# Patient Record
Sex: Female | Born: 1935 | Race: White | Hispanic: No | Smoking: Former smoker
Health system: Southern US, Community
[De-identification: ages and names within clinical notes are randomized; demographics above are authoritative.]

## PROBLEM LIST (undated history)

## (undated) DIAGNOSIS — I10 Essential (primary) hypertension: Secondary | ICD-10-CM

## (undated) DIAGNOSIS — H3530 Unspecified macular degeneration: Secondary | ICD-10-CM

## (undated) DIAGNOSIS — E7800 Pure hypercholesterolemia, unspecified: Secondary | ICD-10-CM

## (undated) DIAGNOSIS — M1990 Unspecified osteoarthritis, unspecified site: Secondary | ICD-10-CM

## (undated) DIAGNOSIS — N289 Disorder of kidney and ureter, unspecified: Secondary | ICD-10-CM

## (undated) DIAGNOSIS — K449 Diaphragmatic hernia without obstruction or gangrene: Secondary | ICD-10-CM

## (undated) DIAGNOSIS — J449 Chronic obstructive pulmonary disease, unspecified: Secondary | ICD-10-CM

## (undated) HISTORY — PX: APPENDECTOMY: SHX54

## (undated) HISTORY — PX: REPLACEMENT TOTAL KNEE: SUR1224

## (undated) HISTORY — PX: ABDOMINAL HYSTERECTOMY: SHX81

## (undated) HISTORY — PX: BREAST SURGERY: SHX581

## (undated) SURGERY — EGD (ESOPHAGOGASTRODUODENOSCOPY)
Anesthesia: Moderate Sedation

---

## 1997-10-06 ENCOUNTER — Other Ambulatory Visit: Admission: RE | Admit: 1997-10-06 | Discharge: 1997-10-06 | Payer: Self-pay | Admitting: Obstetrics and Gynecology

## 1998-10-20 ENCOUNTER — Other Ambulatory Visit: Admission: RE | Admit: 1998-10-20 | Discharge: 1998-10-20 | Payer: Self-pay | Admitting: Obstetrics and Gynecology

## 1999-12-05 ENCOUNTER — Other Ambulatory Visit: Admission: RE | Admit: 1999-12-05 | Discharge: 1999-12-05 | Payer: Self-pay | Admitting: Obstetrics and Gynecology

## 2001-05-06 ENCOUNTER — Observation Stay (HOSPITAL_COMMUNITY): Admission: RE | Admit: 2001-05-06 | Discharge: 2001-05-07 | Payer: Self-pay | Admitting: Obstetrics and Gynecology

## 2003-12-18 ENCOUNTER — Ambulatory Visit: Admission: RE | Admit: 2003-12-18 | Discharge: 2003-12-18 | Payer: Self-pay | Admitting: Orthopedic Surgery

## 2003-12-31 ENCOUNTER — Ambulatory Visit (HOSPITAL_COMMUNITY): Admission: RE | Admit: 2003-12-31 | Discharge: 2003-12-31 | Payer: Self-pay | Admitting: Orthopedic Surgery

## 2004-08-29 ENCOUNTER — Ambulatory Visit: Payer: Self-pay | Admitting: Cardiology

## 2004-09-02 ENCOUNTER — Ambulatory Visit: Payer: Self-pay | Admitting: Cardiology

## 2004-09-14 ENCOUNTER — Ambulatory Visit: Payer: Self-pay | Admitting: Physical Medicine & Rehabilitation

## 2004-09-14 ENCOUNTER — Inpatient Hospital Stay (HOSPITAL_COMMUNITY): Admission: RE | Admit: 2004-09-14 | Discharge: 2004-09-19 | Payer: Self-pay | Admitting: Orthopedic Surgery

## 2004-09-19 ENCOUNTER — Inpatient Hospital Stay
Admission: RE | Admit: 2004-09-19 | Discharge: 2004-09-24 | Payer: Self-pay | Admitting: Physical Medicine & Rehabilitation

## 2006-08-03 IMAGING — CT CT CHEST W/ CM
1 of 2 series · 14 of 30 positions shown, 18 images · IV contrast (omnipaque)
Comparison: none

CLINICAL DATA: Left upper lobe pulmonary nodule on conventional radiography.
TECHNIQUE: Contiguous axial CT images were obtained from the lung apices to the bases following the intravenous administration of 100 cc of Omnipaque 300 IV contrast.
 Comparing chest radiograph of 12/18/03.
 CT OF THE CHEST WITH CONTRAST:

[Series 3: — · axial · 0.64mm/px · z∈[-322,-42]mm · 14 of 66 slices shown, 18 images]
[im 5/66  mediastinal]
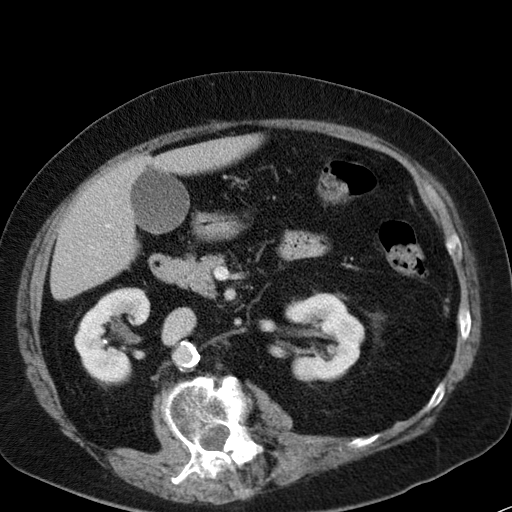
[im 5/66  lung]
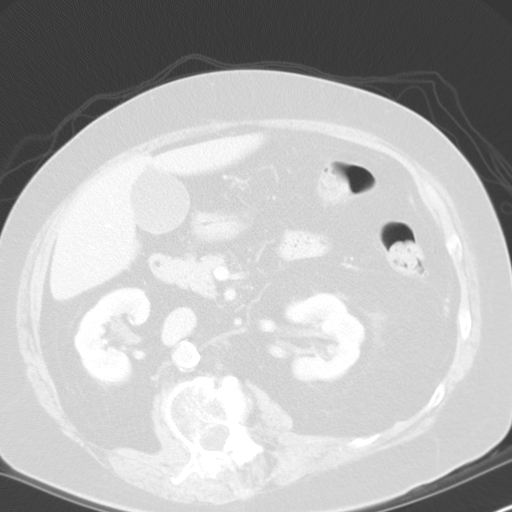
[im 10/66  lung]
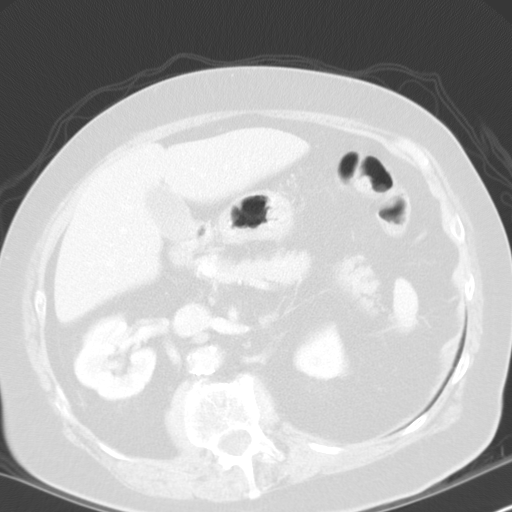
[im 14/66  lung]
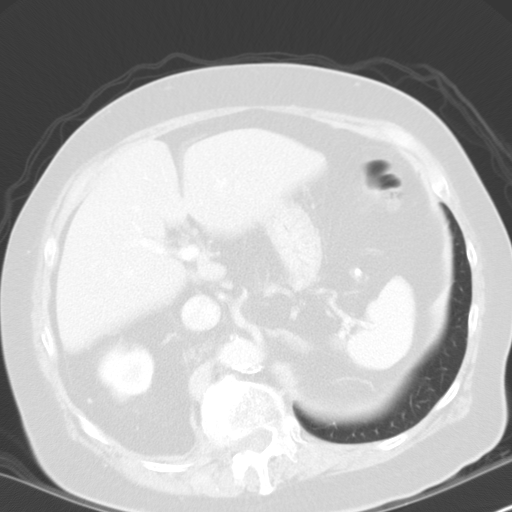
[im 19/66  lung]
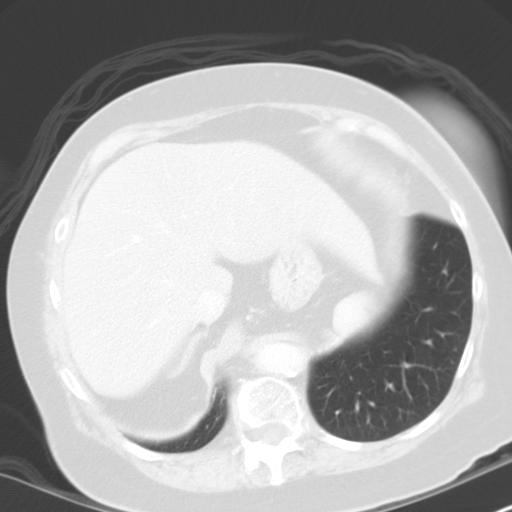
[im 24/66  mediastinal]
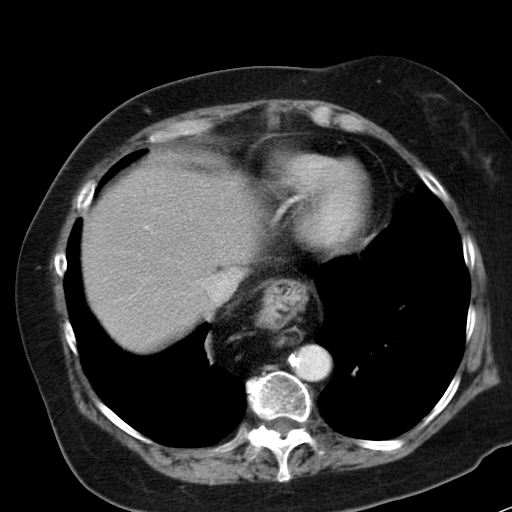
[im 24/66  lung]
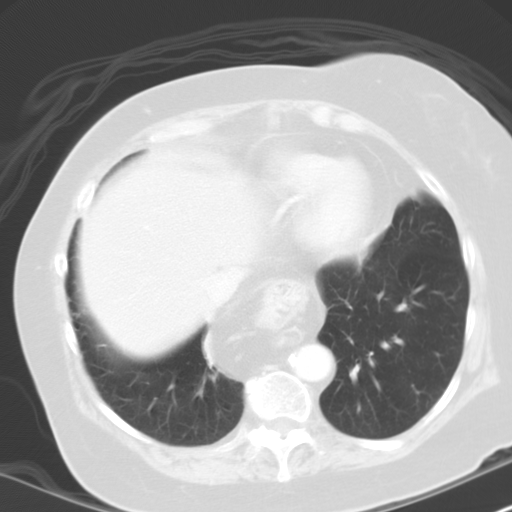
[im 28/66  lung]
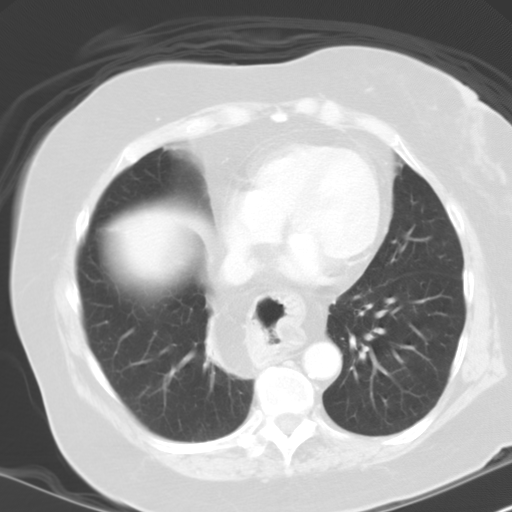
[im 31/66  lung]
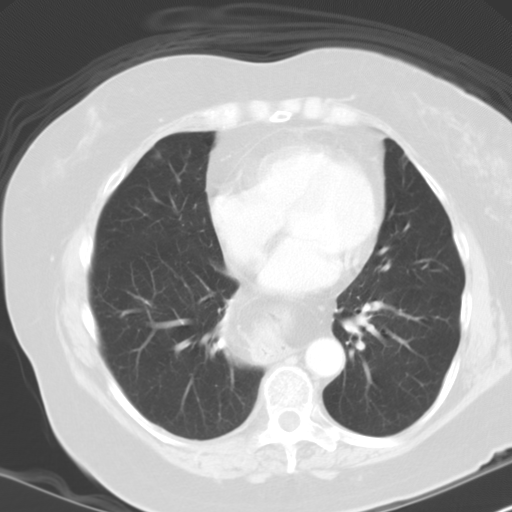
[im 33/66  lung]
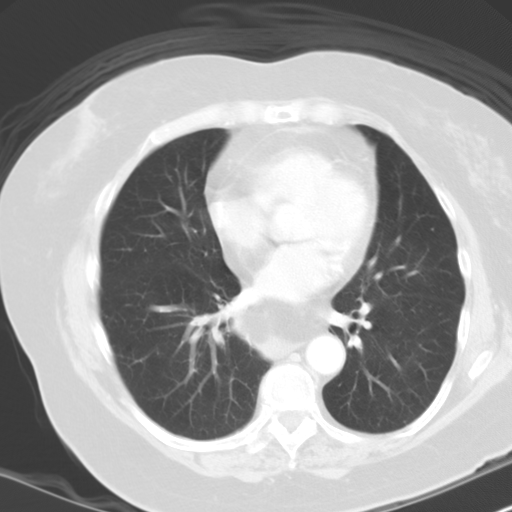
[im 38/66  mediastinal]
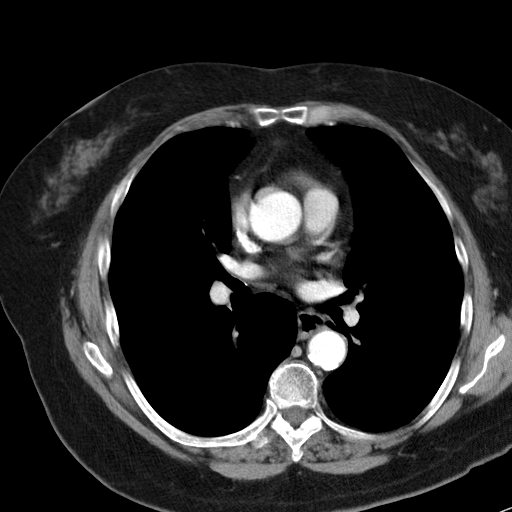
[im 38/66  lung]
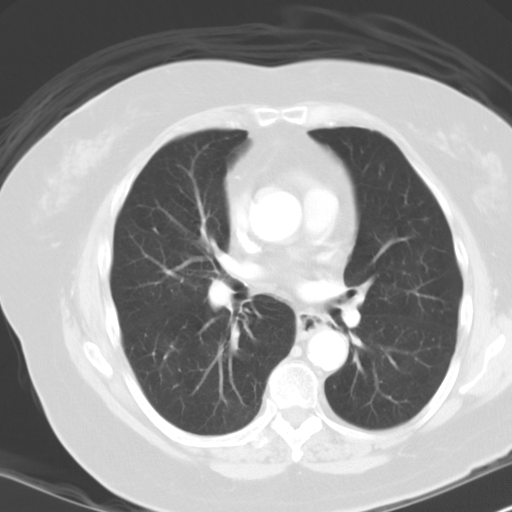
[im 42/66  lung]
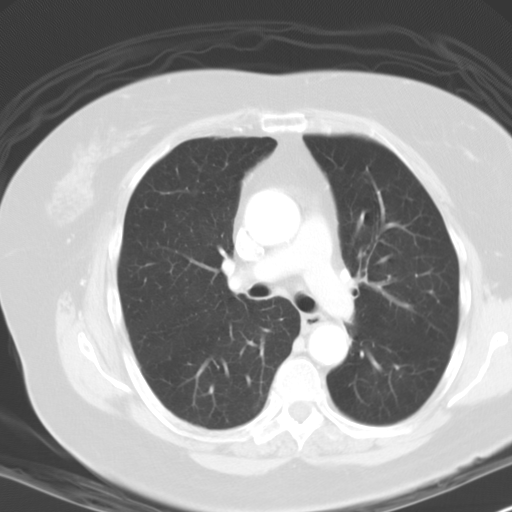
[im 47/66  lung]
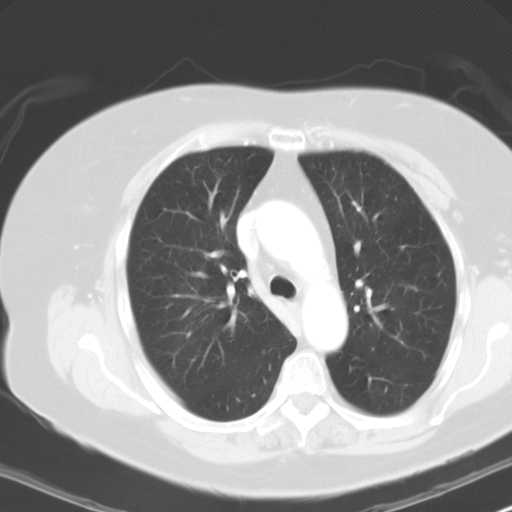
[im 52/66  lung]
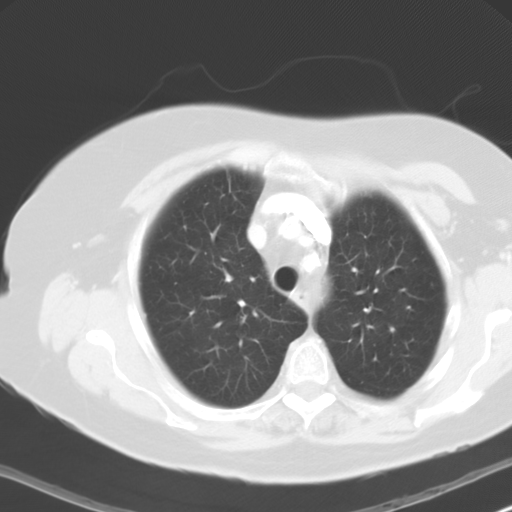
[im 56/66  mediastinal]
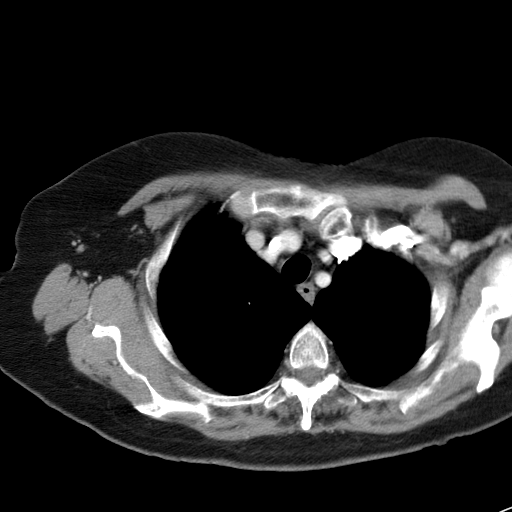
[im 56/66  lung]
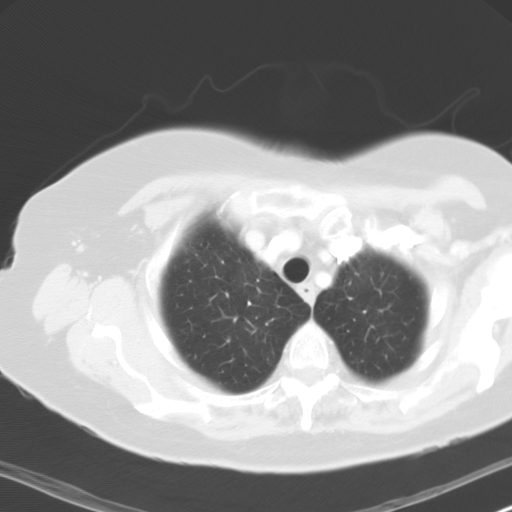
[im 61/66  lung]
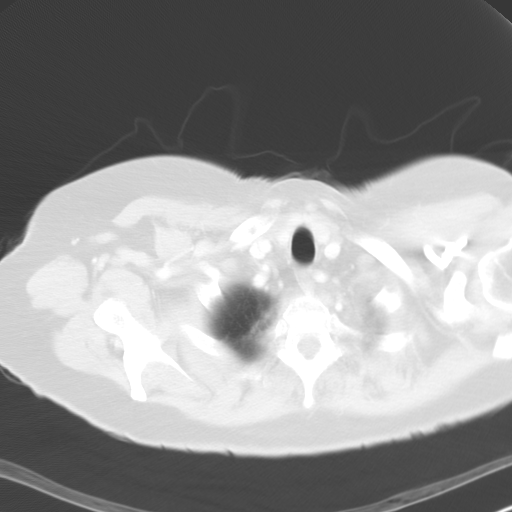

[14 of 30 positions shown; findings below may reference images not displayed]

FINDINGS: The scout view reveals lumbar scoliosis.  There is a moderate sized hiatal hernia. 
 There is some atherosclerotic calcification of the aortic arch and branch vessels. No pathologic hilar or mediastinal adenopathy.  
 The nodular density was conspicuous on the prior chest radiograph, but there is no intrapulmonary nodule in this region on chest CT.  I suspect that this either represented artifact, skin lesion, or a small axillary or subpectoral node that was inadvertently conspicuous on conventional radiography.  No pulmonary nodule is currently evident.  Tracheobronchial tree appears unremarkable. In the left midkidney there is a nonobstructive 4 mm calculus and in the left kidney lower pole there is a 2 to 3 mm calculus. In the right mid kidney there is a 2 to 3 mm nonobstructive calculus.
IMPRESSION: 1.  No pulmonary nodule is visible on CT scan.  This likely represented an incidental or artifactual finding on conventional radiography.
 2.  Hiatal hernia. 
 3.  Nephrolithiasis.

## 2016-09-14 ENCOUNTER — Emergency Department (HOSPITAL_COMMUNITY)
Admission: EM | Admit: 2016-09-14 | Discharge: 2016-09-14 | Disposition: A | Discharge status: Home / Self Care | Payer: Medicare Other | Attending: Emergency Medicine | Admitting: Emergency Medicine

## 2016-09-14 ENCOUNTER — Encounter (HOSPITAL_COMMUNITY)
Admission: EM | Disposition: A | Discharge status: Home / Self Care | Payer: Self-pay | Source: Home / Self Care | Attending: Emergency Medicine

## 2016-09-14 ENCOUNTER — Encounter (HOSPITAL_COMMUNITY): Payer: Self-pay | Admitting: Emergency Medicine

## 2016-09-14 DIAGNOSIS — I129 Hypertensive chronic kidney disease with stage 1 through stage 4 chronic kidney disease, or unspecified chronic kidney disease: Secondary | ICD-10-CM | POA: Diagnosis not present

## 2016-09-14 DIAGNOSIS — E7800 Pure hypercholesterolemia, unspecified: Secondary | ICD-10-CM | POA: Insufficient documentation

## 2016-09-14 DIAGNOSIS — N183 Chronic kidney disease, stage 3 (moderate): Secondary | ICD-10-CM | POA: Insufficient documentation

## 2016-09-14 DIAGNOSIS — T18128A Food in esophagus causing other injury, initial encounter: Secondary | ICD-10-CM | POA: Diagnosis not present

## 2016-09-14 DIAGNOSIS — Z79810 Long term (current) use of selective estrogen receptor modulators (SERMs): Secondary | ICD-10-CM | POA: Diagnosis not present

## 2016-09-14 DIAGNOSIS — Z96659 Presence of unspecified artificial knee joint: Secondary | ICD-10-CM | POA: Diagnosis not present

## 2016-09-14 DIAGNOSIS — K222 Esophageal obstruction: Secondary | ICD-10-CM | POA: Diagnosis not present

## 2016-09-14 DIAGNOSIS — Z87891 Personal history of nicotine dependence: Secondary | ICD-10-CM | POA: Insufficient documentation

## 2016-09-14 DIAGNOSIS — J449 Chronic obstructive pulmonary disease, unspecified: Secondary | ICD-10-CM | POA: Diagnosis not present

## 2016-09-14 DIAGNOSIS — Z7951 Long term (current) use of inhaled steroids: Secondary | ICD-10-CM | POA: Insufficient documentation

## 2016-09-14 DIAGNOSIS — K449 Diaphragmatic hernia without obstruction or gangrene: Secondary | ICD-10-CM | POA: Diagnosis not present

## 2016-09-14 DIAGNOSIS — Z79899 Other long term (current) drug therapy: Secondary | ICD-10-CM | POA: Diagnosis not present

## 2016-09-14 DIAGNOSIS — X58XXXA Exposure to other specified factors, initial encounter: Secondary | ICD-10-CM | POA: Diagnosis not present

## 2016-09-14 DIAGNOSIS — R1110 Vomiting, unspecified: Secondary | ICD-10-CM | POA: Diagnosis present

## 2016-09-14 HISTORY — DX: Unspecified osteoarthritis, unspecified site: M19.90

## 2016-09-14 HISTORY — PX: ESOPHAGOGASTRODUODENOSCOPY: SHX5428

## 2016-09-14 HISTORY — DX: Disorder of kidney and ureter, unspecified: N28.9

## 2016-09-14 HISTORY — DX: Unspecified macular degeneration: H35.30

## 2016-09-14 HISTORY — DX: Essential (primary) hypertension: I10

## 2016-09-14 HISTORY — DX: Pure hypercholesterolemia, unspecified: E78.00

## 2016-09-14 HISTORY — DX: Chronic obstructive pulmonary disease, unspecified: J44.9

## 2016-09-14 HISTORY — DX: Diaphragmatic hernia without obstruction or gangrene: K44.9

## 2016-09-14 LAB — CBC WITH DIFFERENTIAL/PLATELET
Basophils Absolute: 0 10*3/uL (ref 0.0–0.1)
Basophils Relative: 1 %
Eosinophils Absolute: 0.3 10*3/uL (ref 0.0–0.7)
Eosinophils Relative: 4 %
HCT: 41.8 % (ref 36.0–46.0)
Hemoglobin: 13.4 g/dL (ref 12.0–15.0)
Lymphocytes Relative: 36 %
Lymphs Abs: 2.6 10*3/uL (ref 0.7–4.0)
MCH: 31.2 pg (ref 26.0–34.0)
MCHC: 32.1 g/dL (ref 30.0–36.0)
MCV: 97.2 fL (ref 78.0–100.0)
Monocytes Absolute: 0.5 10*3/uL (ref 0.1–1.0)
Monocytes Relative: 7 %
Neutro Abs: 3.8 10*3/uL (ref 1.7–7.7)
Neutrophils Relative %: 52 %
Platelets: 281 10*3/uL (ref 150–400)
RBC: 4.3 MIL/uL (ref 3.87–5.11)
RDW: 15.7 % — ABNORMAL HIGH (ref 11.5–15.5)
WBC: 7.3 10*3/uL (ref 4.0–10.5)

## 2016-09-14 LAB — COMPREHENSIVE METABOLIC PANEL
ALT: 15 U/L (ref 14–54)
AST: 21 U/L (ref 15–41)
Albumin: 3.8 g/dL (ref 3.5–5.0)
Alkaline Phosphatase: 55 U/L (ref 38–126)
Anion gap: 8 (ref 5–15)
BUN: 12 mg/dL (ref 6–20)
CO2: 26 mmol/L (ref 22–32)
Calcium: 8.7 mg/dL — ABNORMAL LOW (ref 8.9–10.3)
Chloride: 104 mmol/L (ref 101–111)
Creatinine, Ser: 0.77 mg/dL (ref 0.44–1.00)
GFR calc Af Amer: >60 mL/min (ref >60)
GFR calc non Af Amer: >60 mL/min (ref >60)
Glucose, Bld: 100 mg/dL — ABNORMAL HIGH (ref 65–99)
Potassium: 3.9 mmol/L (ref 3.5–5.1)
Sodium: 138 mmol/L (ref 135–145)
Total Bilirubin: 0.5 mg/dL (ref 0.3–1.2)
Total Protein: 7.3 g/dL (ref 6.5–8.1)

## 2016-09-14 SURGERY — EGD (ESOPHAGOGASTRODUODENOSCOPY)
Anesthesia: Moderate Sedation

## 2016-09-14 MED ORDER — SODIUM CHLORIDE 0.9 % IV SOLN
INTRAVENOUS | Status: DC
Start: 2016-09-14 — End: 2016-09-14

## 2016-09-14 MED ORDER — GLUCAGON HCL RDNA (DIAGNOSTIC) 1 MG IJ SOLR
1.0000 mg | Freq: Once | INTRAMUSCULAR | Status: AC
  Administered 2016-09-14: 1 mg via INTRAVENOUS
  Filled 2016-09-14: qty 1

## 2016-09-14 MED ORDER — LIDOCAINE VISCOUS 2 % MT SOLN
OROMUCOSAL | Status: AC
  Filled 2016-09-14: qty 15

## 2016-09-14 MED ORDER — LORAZEPAM 2 MG/ML IJ SOLN
1.0000 mg | Freq: Once | INTRAMUSCULAR | Status: AC
  Administered 2016-09-14: 1 mg via INTRAVENOUS
  Filled 2016-09-14: qty 1

## 2016-09-14 MED ORDER — MIDAZOLAM HCL 5 MG/5ML IJ SOLN
INTRAMUSCULAR | Status: DC | PRN
  Administered 2016-09-14: 1 mg via INTRAVENOUS

## 2016-09-14 MED ORDER — SODIUM CHLORIDE 0.9 % IV BOLUS (SEPSIS)
500.0000 mL | Freq: Once | INTRAVENOUS | Status: AC
  Administered 2016-09-14: 500 mL via INTRAVENOUS

## 2016-09-14 MED ORDER — MEPERIDINE HCL 100 MG/ML IJ SOLN
INTRAMUSCULAR | Status: DC | PRN
  Administered 2016-09-14: 25 mg via INTRAVENOUS

## 2016-09-14 MED ORDER — FENTANYL CITRATE (PF) 100 MCG/2ML IJ SOLN
INTRAMUSCULAR | Status: AC
  Filled 2016-09-14: qty 2

## 2016-09-14 MED ORDER — LIDOCAINE VISCOUS 2 % MT SOLN
OROMUCOSAL | Status: DC | PRN
  Administered 2016-09-14: 4 mL via OROMUCOSAL

## 2016-09-14 MED ORDER — STERILE WATER FOR IRRIGATION IR SOLN
Status: DC | PRN
  Administered 2016-09-14: 100 mL

## 2016-09-14 MED ORDER — ONDANSETRON HCL 4 MG/2ML IJ SOLN
INTRAMUSCULAR | Status: AC
  Filled 2016-09-14: qty 2

## 2016-09-14 MED ORDER — MIDAZOLAM HCL 5 MG/5ML IJ SOLN
INTRAMUSCULAR | Status: AC
  Filled 2016-09-14: qty 10

## 2016-09-14 NOTE — Op Note (Signed)
[facility] Patient Name: Morgan Chapman Procedure Date: 09/14/2016 5:16 PM MRN: [id number] Date of Birth: 1924/11/18 Attending MD: Gennette Pac , MD CSN: [id number] Age: 83 Admit Type: Outpatient Procedure:                Upper GI endoscopy Indications:              Foreign body in the esophagus Providers:                Gennette Pac, MD, Edrick Kins, RN, Burke Keels, Technician Referring MD:              Medicines:                Midazolam 1 mg IV, Fentanyl 25 micrograms IV,                            Ondansetron 4 mg IV Complications:            No immediate complications. Estimated Blood Loss:     Estimated blood loss was minimal. Estimated blood                            loss was minimal. Procedure:                Pre-Anesthesia Assessment:                           - Prior to the procedure, a History and Physical                            was performed, and patient medications and                            allergies were reviewed. The patient's tolerance of                            previous anesthesia was also reviewed. The risks                            and benefits of the procedure and the sedation                            options and risks were discussed with the patient.                            All questions were answered, and informed consent                            was obtained. Prior Anticoagulants: The patient has                            taken no previous anticoagulant or antiplatelet  agents. ASA Grade Assessment: III - A patient with                            severe systemic disease. After reviewing the risks                            and benefits, the patient was deemed in                            satisfactory condition to undergo the procedure.                           After obtaining informed consent, the endoscope was                            passed under direct  vision. Throughout the                            procedure, the patient's blood pressure, pulse, and                            oxygen saturations were monitored continuously. The                            EG-[id number] ([id number]) scope was introduced through the                            mouth, and advanced to the gastric cardia. The                            upper GI endoscopy was accomplished without                            difficulty. The patient tolerated the procedure                            well. Scope In: 5:47:12 PM Scope Out: 5:54:30 PM Total Procedure Duration: 0 hours 7 minutes 18 seconds  Findings:      large meat bolus ball-valving valving in the distal esophagus. I gently       attempted to push it through with the tip of the scope. It would move.       Subsequently, using the rescue net, a large piece of it was pulled out.       The scope was reintroduced in the esophagus and the remaining food bolus       was seen to have dropped into the hiatal hernia sac spontaneously.       Patient had somewhat of a thick muscular Schatzki's ring with significt       overlying maceration. Please see photos. Fair amount of food debris seen       in hernia sac which was moderate to large in size. Complete exam not       attempted. Dilation was not attempted (per plan) Impression:               - Esophageal food  impaction - status post                            disimpaction as described. Prominent Schatzki's                            ring. Large hiatal hernia. Exam incomplete. Moderate Sedation:      Moderate (conscious) sedation was administered by the endoscopy nurse       and supervised by the endoscopist. The following parameters were       monitored: oxygen saturation, heart rate, blood pressure, respiratory       rate, EKG, adequacy of pulmonary ventilation, and response to care.       Total physician intraservice time was 10 minutes. Recommendation:           - Patient has a  contact number available for                            emergencies. The signs and symptoms of potential                            delayed complications were discussed with the                            patient. Return to normal activities tomorrow.                            Written discharge instructions were provided to the                            patient.                           - Pureed diet.                           - Continue present medications.                           - Use a proton pump inhibitor PO daily. Begin                            Protonix 40 mg daily. Follow-up with [facility]                            gastroenterologist for complete EGD and esophageal                            dilation. Procedure Code(s):        --- Professional ---                           43200, Esophagoscopy, flexible, transoral;                            diagnostic, including collection of specimen(s) by  brushing or washing, when performed (separate                            procedure)                           99152, Moderate sedation services provided by the                            same physician or other qualified health care                            professional performing the diagnostic or                            therapeutic service that the sedation supports,                            requiring the presence of an independent trained                            observer to assist in the monitoring of the                            patient's level of consciousness and physiological                            status; initial 15 minutes of intraservice time,                            patient age 98 years or older Diagnosis Code(s):        --- Professional ---                           T18.108A, Unspecified foreign body in esophagus                            causing other injury, initial encounter CPT copyright 2016 American Medical Association. All  rights reserved. The codes documented in this report are preliminary and upon coder review may  be revised to meet current compliance requirements. Gerrit Friends. Nidal Rivet, MD Gennette Pac, MD 09/14/2016 6:09:04 PM This report has been signed electronically. Number of Addenda: 0

## 2016-09-14 NOTE — H&P (Signed)
 @LOGO @   Primary Care Physician:  System, Provider Not In Primary Gastroenterologist:  Dr. Jena Gaussourk  Pre-Procedure History & Physical: HPI:  Morgan Chapman is a 83 y.o. female here for evaluation of probable food impaction. [place] Has been eating more than she normally does. Consumed a hot dog and some other foods this morning. Subsequently, was unable to swallow any solids or liquids. In fact, not even able to swallow saliva. Patient seen by Dr. Dr. Judd LieneLo in the ED. Given glucagon and carbonated beverage. Glucagon hasn't helped. Beverage immediately came back. Patient family desires urgent intervention this time. No prior history of food impaction. Does admit to chronic GERD symptoms.   Past Medical History:  Diagnosis Date  . Arthritis   . COPD (chronic obstructive pulmonary disease) (HCC)   . Hiatal hernia   . High cholesterol   . Hypertension   . Macular degeneration   . Renal disorder    CKD III    Past Surgical History:  Procedure Laterality Date  . ABDOMINAL HYSTERECTOMY    . APPENDECTOMY    . BREAST SURGERY     lumpectomy  . REPLACEMENT TOTAL KNEE      Prior to Admission medications   Medication Sig Start Date End Date Taking? Authorizing Provider  amLODipine (NORVASC) 5 MG tablet Take 5 mg by mouth daily. 06/14/16  Yes [provider]  BROVANA 15 MCG/2ML NEBU Inhale 2 mLs into the lungs 2 (two) times daily.  09/04/16  Yes [provider]  budesonide (PULMICORT) 0.5 MG/2ML nebulizer solution Inhale 2 mLs into the lungs 2 (two) times daily.  09/08/16  Yes [provider]  calcium carbonate (OSCAL) 1500 (600 Ca) MG TABS tablet Take 1 tablet by mouth daily.   Yes [provider]  colestipol (COLESTID) 1 g tablet Take 1 g by mouth daily. 08/07/16  Yes [provider]  montelukast (SINGULAIR) 10 MG tablet Take 10 mg by mouth every evening. 07/14/16  Yes [provider]  Multiple Vitamins-Minerals (ICAPS AREDS 2  PO) Take 1 capsule by mouth 2 (two) times daily.   Yes [provider]  Omega-3 Fatty Acids (OMEGA-3 CF PO) Take 1 capsule by mouth 3 (three) times a week.   Yes [provider]  omeprazole (PRILOSEC OTC) 20 MG tablet Take 20 mg by mouth daily as needed.   Yes [provider]  raloxifene (EVISTA) 60 MG tablet Take 60 mg by mouth daily. 08/26/16  Yes [provider]  sertraline (ZOLOFT) 100 MG tablet Take 100 mg by mouth daily. 06/14/16  Yes [provider]  simvastatin (ZOCOR) 10 MG tablet Take 10 mg by mouth every evening. 08/16/16  Yes [provider]    Allergies as of 09/14/2016 - Review Complete 09/14/2016  Allergen Reaction Noted  . Penicillins  09/14/2016    No family history on file.  Social History   Social History  . Marital status: Divorced    Spouse name: N/A  . Number of children: N/A  . Years of education: N/A   Occupational History  . Not on file.   Social History Main Topics  . Smoking status: Former Games developermoker  . Smokeless tobacco: Never Used  . Alcohol use No  . Drug use: No  . Sexual activity: Not on file   Other Topics Concern  . Not on file   Social History Narrative  . No narrative on file    Review of Systems: See HPI, otherwise negative  ROS  Physical Exam: BP 126/63   Pulse 75   Temp 98.7 F (37.1 C) (Oral)   Resp 20   Ht 5\' 1"  (1.549 m)   Wt 166 lb (75.3 kg)   SpO2 94%   BMI 31.37 kg/m  General:   Elderly pleasant conversant and cooperative in NAD Neck:  Supple; no masses or thyromegaly. No significant cervical adenopathy. Lungs:  Clear throughout to auscultation.   No wheezes, crackles, or rhonchi. No acute distress. Heart:  Regular rate and rhythm; no murmurs, clicks, rubs,  or gallops. Abdomen: Non-distended, normal bowel sounds.  Soft and nontender without appreciable mass or hepatosplenomegaly.  Pulses:  Normal pulses noted. Extremities:  Without clubbing or edema.  Impression:   Pleasant 83 year old lady from [place] presents with signs and symptoms consistent with esophageal food impaction. Reported a large history of hiatal hernia. Not mentioned above, just spoke with daughter who states she's had episodes of difficulty swallowing meats, etc. sometimes regurgitates them back up after she attempts to swallow. She takes OTC Prilosec on an on and off basis depending on symptoms.    Recommendations: I have offered the patient in urgent EGD for disimpaction. I specifically spoke to patient and daughter about limitations of this procedure. Specifically, if there is food in the upper GI tract and/ or the esophagus is macerated, would not attempt dilation today. That will need to be done in an elective setting. The risks, benefits, limitations, alternatives and imponderables have been reviewed with the patient. Potential for esophageal dilation, biopsy, etc. have also been reviewed.  Questions have been answered. All parties agreeable.Questions answered. All port is agreeable.   Further recommendations to follow.        Notice: This dictation was prepared with Dragon dictation along with smaller phrase technology. Any transcriptional errors that result from this process are unintentional and may not be corrected upon review.

## 2016-09-14 NOTE — ED Notes (Signed)
Pt given Diet Coke to drink per EDP verbal order. Pt attempted to swallow the fluid but it immediately came right back up. Pt attempted twice with no success. EDP notified.

## 2016-09-14 NOTE — Discharge Instructions (Addendum)
EGD Discharge instructions Please read the instructions outlined below and refer to this sheet in the next few weeks. These discharge instructions provide you with general information on caring for yourself after you leave the hospital. Your doctor may also give you specific instructions. While your treatment has been planned according to the most current medical practices available, unavoidable complications occasionally occur. If you have any problems or questions after discharge, please call your doctor. ACTIVITY  You may resume your regular activity but move at a slower pace for the next 24 hours.   Take frequent rest periods for the next 24 hours.   Walking will help expel (get rid of) the air and reduce the bloated feeling in your abdomen.   No driving for 24 hours (because of the anesthesia (medicine) used during the test).   You may shower.   Do not sign any important legal documents or operate any machinery for 24 hours (because of the anesthesia used during the test).  NUTRITION  Drink plenty of fluids.   You may resume your normal diet.   Begin with a light meal and progress to your normal diet.   Avoid alcoholic beverages for 24 hours or as instructed by your caregiver.  MEDICATIONS  You may resume your normal medications unless your caregiver tells you otherwise.  WHAT YOU CAN EXPECT TODAY  You may experience abdominal discomfort such as a feeling of fullness or gas pains.  FOLLOW-UP  Your doctor will discuss the results of your test with you.  SEEK IMMEDIATE MEDICAL ATTENTION IF ANY OF THE FOLLOWING OCCUR:  Excessive nausea (feeling sick to your stomach) and/or vomiting.   Severe abdominal pain and distention (swelling).   Trouble swallowing.   Temperature over 101 F (37.8 C).   Rectal bleeding or vomiting of blood.   Would avoid meats and bread as much as possible.  Soft/Pure diet  Begin Protonix 40 mg daily  See your gastroenterologist in  [place] regarding elective EGD to complete evaluation and dilate the esophagus  Soft-Food Meal Plan A soft-food meal plan includes foods that are safe and easy to swallow. This meal plan typically is used:  If you are having trouble chewing or swallowing foods.  As a transition meal plan after only having had liquid meals for a long period.  What do I need to know about the soft-food meal plan? A soft-food meal plan includes tender foods that are soft and easy to chew and swallow. In most cases, bite-sized pieces of food are easier to swallow. A bite-sized piece is about  inch or smaller. Foods in this plan do not need to be ground or pureed. Foods that are very hard, crunchy, or sticky should be avoided. Also, breads, cereals, yogurts, and desserts with nuts, seeds, or fruits should be avoided. What foods can I eat? Grains Rice and wild rice. Moist bread, dressing, pasta, and noodles. Well-moistened dry or cooked cereals, such as farina (cooked wheat cereal), oatmeal, or grits. Biscuits, breads, muffins, pancakes, and waffles that have been well moistened. Vegetables Shredded lettuce. Cooked, tender vegetables, including potatoes without skins. Vegetable juices. Broths or creamed soups made with vegetables that are not stringy or chewy. Strained tomatoes (without seeds). Fruits Canned or well-cooked fruits. Soft (ripe), peeled fresh fruits, such as peaches, nectarines, kiwi, cantaloupe, honeydew melon, and watermelon (without seeds). Soft berries with small seeds, such as strawberries. Fruit juices (without pulp). Meats and Other Protein Sources Moist, tender, lean beef. Mutton. Lamb. Veal. Chicken. [place]. Liver.  Ham. Fish without bones. Eggs. Dairy Milk, milk drinks, and cream. Plain cream cheese and cottage cheese. Plain yogurt. Sweets/Desserts Flavored gelatin desserts. Custard. Plain ice cream, frozen yogurt, sherbet, milk shakes, and malts. Plain cakes and cookies. Plain hard  candy. Other Butter, margarine (without trans fat), and cooking oils. Mayonnaise. Cream sauces. Mild spices, salt, and sugar. Syrup, molasses, honey, and jelly. The items listed above may not be a complete list of recommended foods or beverages. Contact your dietitian for more options. What foods are not recommended? Grains Dry bread, toast, crackers that have not been moistened. Coarse or dry cereals, such as bran, granola, and shredded wheat. Tough or chewy crusty breads, such as [place] bread or baguettes. Vegetables Corn. Raw vegetables except shredded lettuce. Cooked vegetables that are tough or stringy. Tough, crisp, fried potatoes and potato skins. Fruits Fresh fruits with skins or seeds or both, such as apples, pears, or grapes. Stringy, high-pulp fruits, such as papaya, pineapple, coconut, or mango. Fruit leather, fruit roll-ups, and all dried fruits. Meats and Other Protein Sources Sausages and hot dogs. Meats with gristle. Fish with bones. Nuts, seeds, and chunky peanut or other nut butters. Sweets/Desserts Cakes or cookies that are very dry or chewy. The items listed above may not be a complete list of foods and beverages to avoid. Contact your dietitian for more information. This information is not intended to replace advice given to you by your health care provider. Make sure you discuss any questions you have with your health care provider. Document Released: 04/25/2007 Document Revised: 06/24/2015 Document Reviewed: 12/13/2012 Elsevier Interactive Patient Education  2017 [employer]

## 2016-09-14 NOTE — ED Provider Notes (Signed)
[facility]-EMER[facility]Y DEPT Provider Note   CSN: [id number] Arrival date & time: 09/14/16  1325     History   Chief Complaint Chief Complaint  Patient presents with  . Emesis    HPI Morgan Chapman is a 83 y.o. female.  Patient is a 83 year old female with history of COPD, hypertension, chronic renal insufficiency, and large hiatal hernia. She presents for evaluation of difficulty swallowing and spitting up her secretions. This started this morning. She has been unable to tolerate any solids, liquids, indications, and is unable to swallow her saliva. She denies any abdominal pain, chest pain, or difficulty breathing.  She is here visiting from [place] and has a gastroenterologist there and has diagnosed her hiatal hernia. While visiting here, she has been eating foods that she does not normally eat including hotdogs, sausage, and bacon. She had bacon this morning prior to the onset of her symptoms.   The history is provided by the patient.  Emesis   This is a new problem. Episode onset: This morning. The problem occurs continuously. The problem has not changed since onset.There has been no fever. Pertinent negatives include no chills and no fever.    Past Medical History:  Diagnosis Date  . Arthritis   . COPD (chronic obstructive pulmonary disease) (HCC)   . Hiatal hernia   . High cholesterol   . Hypertension   . Macular degeneration   . Renal disorder    CKD III    There are no active problems to display for this patient.   Past Surgical History:  Procedure Laterality Date  . ABDOMINAL HYSTERECTOMY    . APPENDECTOMY    . BREAST SURGERY     lumpectomy  . REPLACEMENT TOTAL KNEE      OB History    No data available       Home Medications    Prior to Admission medications   Not on File    Family History No family history on file.  Social History Social History  Substance Use Topics  . Smoking status: Former Games developer  . Smokeless tobacco: Never Used  . Alcohol  use No     Allergies   Penicillins   Review of Systems Review of Systems  Constitutional: Negative for chills and fever.  Gastrointestinal: Positive for vomiting.  All other systems reviewed and are negative.    Physical Exam Updated Vital Signs BP 130/72 (BP Location: Right Arm)   Pulse 87   Temp 98.7 F (37.1 C) (Oral)   Resp 20   Ht 5\' 1"  (1.549 m)   Wt 75.3 kg (166 lb)   SpO2 94%   BMI 31.37 kg/m   Physical Exam  Constitutional: She is oriented to person, place, and time. She appears well-developed and well-nourished. No distress.  HENT:  Head: Normocephalic and atraumatic.  Neck: Normal range of motion. Neck supple.  Cardiovascular: Normal rate and regular rhythm.  Exam reveals no gallop and no friction rub.   No murmur heard. Pulmonary/Chest: Effort normal and breath sounds normal. No respiratory distress. She has no wheezes.  Abdominal: Soft. Bowel sounds are normal. She exhibits no distension. There is no tenderness.  Musculoskeletal: Normal range of motion.  Neurological: She is alert and oriented to person, place, and time.  Skin: Skin is warm and dry. She is not diaphoretic.  Nursing note and vitals reviewed.    ED Treatments / Results  Labs (all labs ordered are listed, but only abnormal results are displayed) Labs Reviewed  CBC WITH DIFFERENTIAL/PLATELET  COMPREHENSIVE METABOLIC PANEL    EKG  EKG Interpretation None       Radiology No results found.  Procedures Procedures (including critical care time)  Medications Ordered in ED Medications  sodium chloride 0.9 % bolus 500 mL (not administered)  LORazepam (ATIVAN) injection 1 mg (not administered)  glucagon (human recombinant) (GLUCAGEN) injection 1 mg (not administered)     Initial Impression / Assessment and Plan / ED Course  I have reviewed the triage vital signs and the nursing notes.  Pertinent labs & imaging results that were available during my care of the patient were  reviewed by me and considered in my medical decision making (see chart for details).  Patient appears to have impacted esophageal food bolus.She has had glucagon and Ativan followed by carbonated beverage with no relief. Dr. Jena Gaussourk to perform endoscopy.  Final Clinical Impressions(s) / ED Diagnoses   Final diagnoses:  None    New Prescriptions New Prescriptions   No medications on file     Geoffery Lyonselo, Ashiya Kinkead, MD 09/15/16 [phone]

## 2016-09-14 NOTE — ED Triage Notes (Addendum)
Pt c/o increase in difficulty swallowing x 2 days. Reports she has large hiatal hernia. Pt able to control secretions at present. Voice clear. O2 sat 94%.

## 2016-09-22 ENCOUNTER — Other Ambulatory Visit: Payer: Self-pay | Admitting: Internal Medicine

## 2016-09-25 ENCOUNTER — Encounter (HOSPITAL_COMMUNITY): Payer: Self-pay | Admitting: Internal Medicine
